# Patient Record
Sex: Male | Born: 1990
Health system: Southern US, Community
[De-identification: ages and names within clinical notes are randomized; demographics above are authoritative.]

## PROBLEM LIST (undated history)

## (undated) DIAGNOSIS — Z973 Presence of spectacles and contact lenses: Secondary | ICD-10-CM

## (undated) DIAGNOSIS — R011 Cardiac murmur, unspecified: Secondary | ICD-10-CM

## (undated) HISTORY — PX: CHOLECYSTECTOMY: SHX55

## (undated) HISTORY — PX: APPENDECTOMY: SHX54

---
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
## 2009-10-27 ENCOUNTER — Ambulatory Visit: Payer: Self-pay | Admitting: Family Medicine

## 2011-12-11 ENCOUNTER — Emergency Department: Payer: Self-pay | Admitting: General Practice

## 2019-01-14 ENCOUNTER — Encounter: Payer: Self-pay | Admitting: Emergency Medicine

## 2019-01-14 ENCOUNTER — Ambulatory Visit
Admission: EM | Admit: 2019-01-14 | Discharge: 2019-01-14 | Disposition: A | Discharge status: Home / Self Care | Payer: 59 | Attending: Family Medicine | Admitting: Family Medicine

## 2019-01-14 ENCOUNTER — Other Ambulatory Visit: Payer: Self-pay

## 2019-01-14 DIAGNOSIS — J111 Influenza due to unidentified influenza virus with other respiratory manifestations: Secondary | ICD-10-CM | POA: Diagnosis not present

## 2019-01-14 DIAGNOSIS — J029 Acute pharyngitis, unspecified: Secondary | ICD-10-CM

## 2019-01-14 DIAGNOSIS — R509 Fever, unspecified: Secondary | ICD-10-CM | POA: Diagnosis not present

## 2019-01-14 LAB — RAPID INFLUENZA A&B ANTIGENS
Influenza A (ARMC): NEGATIVE
Influenza B (ARMC): POSITIVE — AB

## 2019-01-14 LAB — RAPID STREP SCREEN (MED CTR MEBANE ONLY): Streptococcus, Group A Screen (Direct): NEGATIVE

## 2019-01-14 MED ORDER — OSELTAMIVIR PHOSPHATE 75 MG PO CAPS: 75.0000 mg | ORAL_CAPSULE | Freq: Two times a day (BID) | ORAL | 0 refills | Status: DC

## 2019-01-14 NOTE — ED Triage Notes (Signed)
Patient c/o sore throat, fever and cough that started on Monday.

## 2019-01-14 NOTE — ED Provider Notes (Signed)
[facility]    CSN: [id number] Arrival date & time: 01/14/19  1134  History   Chief Complaint Chief Complaint  Patient presents with  . Sore Throat  . Fever   HPI 28 year old male presents with the above complaints.  Patient states that he has been sick since Monday.  Reports fever, sore throat, fatigue, body aches.  Symptoms are moderate in severity.  No known exacerbating relieving factors.  No reported sick contacts.  No other associated symptoms.  No other complaints or concerns at this time.  Hx reviewed as below. PMH; Ileitis  Past Surgical History:  Procedure Laterality Date  . APPENDECTOMY    . CHOLECYSTECTOMY     Home Medications    Prior to Admission medications   Medication Sig Start Date End Date Taking? Authorizing Provider  oseltamivir (TAMIFLU) 75 MG capsule Take 1 capsule (75 mg total) by mouth every 12 (twelve) hours. 01/14/19   Tommie Samsook, Halston Fairclough G, DO   Social History Social History   Tobacco Use  . Smoking status: Never Smoker  . Smokeless tobacco: Never Used  Substance Use Topics  . Alcohol use: Never    Frequency: Never  . Drug use: Never   Allergies   Sulfa antibiotics   Review of Systems Review of Systems  Constitutional: Positive for fatigue. Negative for fever.  HENT: Positive for sore throat.   Musculoskeletal:       Bodyaches.   Physical Exam Triage Vital Signs ED Triage Vitals  Enc Vitals Group     BP 01/14/19 1147 126/83     Pulse Rate 01/14/19 1147 (!) 109     Resp 01/14/19 1147 18     Temp 01/14/19 1147 100 F (37.8 C)     Temp Source 01/14/19 1147 Oral     SpO2 01/14/19 1147 99 %     Weight 01/14/19 1145 200 lb (90.7 kg)     Height 01/14/19 1145 5\' 7"  (1.702 m)     Head Circumference --      Peak Flow --      Pain Score 01/14/19 1145 6     Pain Loc --      Pain Edu? --      Excl. in GC? --    Updated Vital Signs BP 126/83 (BP Location: Left Arm)   Pulse (!) 109   Temp 100 F (37.8 C) (Oral)   Resp 18    Ht 5\' 7"  (1.702 m)   Wt 90.7 kg   SpO2 99%   BMI 31.32 kg/m   Visual Acuity Right Eye Distance:   Left Eye Distance:   Bilateral Distance:    Right Eye Near:   Left Eye Near:    Bilateral Near:     Physical Exam Vitals signs and nursing note reviewed.  Constitutional:      General: He is not in acute distress. HENT:     Head: Normocephalic and atraumatic.     Right Ear: Tympanic membrane normal.     Left Ear: Tympanic membrane normal.     Mouth/Throat:     Pharynx: Oropharynx is clear.  Eyes:     General:        Right eye: No discharge.        Left eye: No discharge.     Conjunctiva/sclera: Conjunctivae normal.  Cardiovascular:     Rate and Rhythm: Normal rate and regular rhythm.  Pulmonary:     Effort: Pulmonary effort is normal.     Breath  sounds: No wheezing, rhonchi or rales.  Neurological:     Mental Status: He is alert.  Psychiatric:        Mood and Affect: Mood normal.        Behavior: Behavior normal.    UC Treatments / Results  Labs (all labs ordered are listed, but only abnormal results are displayed) Labs Reviewed  RAPID INFLUENZA A&B ANTIGENS ([facility] ONLY) - Abnormal; Notable for the following components:      Result Value   Influenza B ([facility]) POSITIVE (*)    All other components within normal limits  RAPID STREP SCREEN ([facility] ONLY)  CULTURE, GROUP A STREP [facility])    EKG None  Radiology No results found.  Procedures Procedures (including critical care time)  Medications Ordered in UC Medications - No data to display  Initial Impression / Assessment and Plan / UC Course  I have reviewed the triage vital signs and the nursing notes.  Pertinent labs & imaging results that were available during my care of the patient were reviewed by me and considered in my medical decision making (see chart for details).     28 year old male presents with influenza. We discussed likely limited benefit of tamiflu given duration of illness. He  elected to proceed with the medication. Work note given.  Final Clinical Impressions(s) / UC Diagnoses   Final diagnoses:  Influenza   Discharge Instructions   None    ED Prescriptions    Medication Sig Dispense Auth. Provider   oseltamivir (TAMIFLU) 75 MG capsule Take 1 capsule (75 mg total) by mouth every 12 (twelve) hours. 10 capsule Tommie Samsook, Gabreille Dardis G, DO     Controlled Substance Prescriptions [place] Controlled Substance Registry consulted? Not Applicable   Tommie SamsCook, Masaki Rothbauer G, DO 01/14/19 1346

## 2019-01-17 LAB — CULTURE, GROUP A STREP (THRC)

## 2019-01-28 ENCOUNTER — Ambulatory Visit (INDEPENDENT_AMBULATORY_CARE_PROVIDER_SITE_OTHER): Payer: 59 | Admitting: Family Medicine

## 2019-01-28 ENCOUNTER — Encounter: Payer: Self-pay | Admitting: Family Medicine

## 2019-01-28 VITALS — BP 120/64 | HR 80 | Ht 67.0 in | Wt 206.0 lb

## 2019-01-28 DIAGNOSIS — F419 Anxiety disorder, unspecified: Secondary | ICD-10-CM

## 2019-01-28 DIAGNOSIS — F321 Major depressive disorder, single episode, moderate: Secondary | ICD-10-CM | POA: Diagnosis not present

## 2019-01-28 DIAGNOSIS — Z7689 Persons encountering health services in other specified circumstances: Secondary | ICD-10-CM | POA: Diagnosis not present

## 2019-01-28 MED ORDER — SERTRALINE HCL 50 MG PO TABS: 50.0000 mg | ORAL_TABLET | Freq: Every day | ORAL | 1 refills | Status: DC

## 2019-01-28 NOTE — Progress Notes (Signed)
Date:  01/28/2019   Name:  Stephen Pratt   DOB:  12/27/1991   MRN:  [id number]   Chief Complaint: Establish Care; Depression (PHQ9=11); and Anxiety (GAD7=18)  Patient is a 28 year old male who presents for an establish care exam. The patient reports the following problems: depression. Health maintenance has been reviewed needs influenza  Depression         This is a new problem.  The current episode started more than 1 year ago.   The onset quality is gradual.   The problem occurs daily.  The problem has been gradually worsening since onset.  Associated symptoms include helplessness, hopelessness, insomnia, restlessness, decreased interest and sad.  Associated symptoms include no myalgias, no headaches and no suicidal ideas.     The symptoms are aggravated by social issues (financial).  Past treatments include nothing.  Past medical history includes anxiety.   Anxiety  Presents for initial visit. Onset was more than 5 years ago. The problem has been gradually worsening. Symptoms include depressed mood, excessive worry, insomnia, nervous/anxious behavior and restlessness. Patient reports no chest pain, dizziness, nausea, obsessions, palpitations, shortness of breath or suicidal ideas. Symptoms occur occasionally. The severity of symptoms is moderate.      Review of Systems  Constitutional: Negative for chills and fever.  HENT: Negative for drooling, ear discharge, ear pain and sore throat.   Respiratory: Negative for cough, shortness of breath and wheezing.   Cardiovascular: Negative for chest pain, palpitations and leg swelling.  Gastrointestinal: Negative for abdominal pain, blood in stool, constipation, diarrhea and nausea.  Endocrine: Negative for polydipsia.  Genitourinary: Negative for dysuria, frequency, hematuria and urgency.  Musculoskeletal: Negative for back pain, myalgias and neck pain.  Skin: Negative for rash.  Allergic/Immunologic: Negative for environmental allergies.    Neurological: Negative for dizziness and headaches.  Hematological: Does not bruise/bleed easily.  Psychiatric/Behavioral: Positive for depression. Negative for suicidal ideas. The patient is nervous/anxious and has insomnia.     There are no active problems to display for this patient.   Allergies  Allergen Reactions  . Sulfa Antibiotics     Past Surgical History:  Procedure Laterality Date  . APPENDECTOMY    . CHOLECYSTECTOMY      Social History   Tobacco Use  . Smoking status: Never Smoker  . Smokeless tobacco: Never Used  Substance Use Topics  . Alcohol use: Never    Frequency: Never  . Drug use: Never     Medication list has been reviewed and updated.  No outpatient medications have been marked as taking for the 01/28/19 encounter (Office Visit) with Duanne Limerick, MD.    [facility] 2/9 Scores 01/28/2019  PHQ - 2 Score 1  PHQ- 9 Score 11    Physical Exam Vitals signs and nursing note reviewed.  Constitutional:      Appearance: He is obese.  HENT:     Head: Normocephalic.     Right Ear: External ear normal.     Left Ear: External ear normal.     Nose: Nose normal.  Eyes:     General: No scleral icterus.       Right eye: No discharge.        Left eye: No discharge.     Conjunctiva/sclera: Conjunctivae normal.     Pupils: Pupils are equal, round, and reactive to light.  Neck:     Musculoskeletal: Normal range of motion and neck supple.     Thyroid: No  thyromegaly.     Vascular: No carotid bruit or JVD.     Trachea: No tracheal deviation.  Cardiovascular:     Rate and Rhythm: Normal rate and regular rhythm.     Heart sounds: Normal heart sounds. No murmur. No friction rub. No gallop.   Pulmonary:     Effort: No respiratory distress.     Breath sounds: Normal breath sounds. No wheezing or rales.  Abdominal:     General: Bowel sounds are normal.     Palpations: Abdomen is soft. There is no mass.     Tenderness: There is no abdominal tenderness. There is  no guarding or rebound.  Musculoskeletal: Normal range of motion.        General: No tenderness.  Lymphadenopathy:     Cervical: No cervical adenopathy.  Skin:    General: Skin is warm.     Findings: No rash.  Neurological:     Mental Status: He is alert and oriented to person, place, and time.     Cranial Nerves: No cranial nerve deficit.     Deep Tendon Reflexes: Reflexes are normal and symmetric.     BP 120/64   Pulse 80   Ht 5\' 7"  (1.702 m)   Wt 206 lb (93.4 kg)   BMI 32.26 kg/m   Assessment and Plan: 1. Establishing care with new doctor, encounter for And to establish care with new physician.  Other than depression and anxiety patient relates no concerns at this time of maintenance is been reviewed and except for influenza patient is up-to-date on everything.  2. Current moderate episode of major depressive disorder, unspecified whether recurrent (HCC) Patient has a PHQ of 11.  Has undergone a divorce and has joint custody of her child we will initiate sertraline 25 mg for 2 weeks to increase to 50 mg over the next 4 weeks.  Patient will be reevaluated in 6 weeks and a complete physical will be done at that time. - sertraline (ZOLOFT) 50 MG tablet; Take 1 tablet (50 mg total) by mouth daily. Initiate at 25 mg/one half tablet a day for 14 days then 1 a day  Dispense: 30 tablet; Refill: 1  3. Anxiety Patient scored an 18 on the gad 4.  As noted above patient was initiated on sertraline will be rechecked in 6 weeks at a complete physical. - sertraline (ZOLOFT) 50 MG tablet; Take 1 tablet (50 mg total) by mouth daily. Initiate at 25 mg/one half tablet a day for 14 days then 1 a day  Dispense: 30 tablet; Refill: 1

## 2019-03-11 ENCOUNTER — Encounter: Payer: 59 | Admitting: Family Medicine

## 2019-04-08 ENCOUNTER — Ambulatory Visit (INDEPENDENT_AMBULATORY_CARE_PROVIDER_SITE_OTHER): Payer: 59 | Admitting: Family Medicine

## 2019-04-08 ENCOUNTER — Encounter: Payer: Self-pay | Admitting: Family Medicine

## 2019-04-08 DIAGNOSIS — F321 Major depressive disorder, single episode, moderate: Secondary | ICD-10-CM

## 2019-04-08 DIAGNOSIS — F419 Anxiety disorder, unspecified: Secondary | ICD-10-CM

## 2019-04-08 MED ORDER — SERTRALINE HCL 50 MG PO TABS: 50.0000 mg | ORAL_TABLET | Freq: Every day | ORAL | 1 refills | Status: DC

## 2019-04-08 NOTE — Progress Notes (Signed)
Date:  04/08/2019   Name:  Stephen Pratt   DOB:  [birth year]   MRN:  [id number]   Chief Complaint: Depression (setraline follow up/ PHQ9=2)  I connected withthis patient, Stephen Pratt, by telephoneat the patient's home.  I verified that I am speaking with the correct person using two identifiers. This visit was conducted via telephone due to the Covid-19 outbreak from my office at [facility] in [place], [place]. I discussed the limitations, risks, security and privacy concerns of performing an evaluation and management service by telephone. I also discussed with the patient that there may be a patient responsible charge related to this service. The patient expressed understanding and agreed to proceed.  Depression         This is a chronic problem.  The current episode started more than 1 month ago.   The problem has been gradually improving since onset.  Associated symptoms include fatigue.  Associated symptoms include no decreased concentration, no helplessness, no hopelessness, does not have insomnia, not irritable, no restlessness, no decreased interest, no appetite change, no body aches, no myalgias, no headaches, no indigestion, not sad and no suicidal ideas.( irratability at times)     The symptoms are aggravated by work stress and family issues.  Past treatments include SSRIs - Selective serotonin reuptake inhibitors.  Compliance with treatment is good.  Previous treatment provided moderate relief.   Review of Systems  Constitutional: Positive for fatigue. Negative for appetite change, chills and fever.  HENT: Negative for drooling, ear discharge, ear pain and sore throat.   Respiratory: Negative for cough, shortness of breath and wheezing.   Cardiovascular: Negative for chest pain, palpitations and leg swelling.  Gastrointestinal: Negative for abdominal pain, blood in stool, constipation, diarrhea and nausea.  Endocrine: Negative for polydipsia.  Genitourinary: Negative for  dysuria, frequency, hematuria and urgency.  Musculoskeletal: Negative for back pain, myalgias and neck pain.  Skin: Negative for rash.  Allergic/Immunologic: Negative for environmental allergies.  Neurological: Negative for dizziness and headaches.  Hematological: Does not bruise/bleed easily.  Psychiatric/Behavioral: Positive for depression. Negative for decreased concentration and suicidal ideas. The patient is not nervous/anxious and does not have insomnia.     There are no active problems to display for this patient.   Allergies  Allergen Reactions  . Sulfa Antibiotics     Past Surgical History:  Procedure Laterality Date  . APPENDECTOMY    . CHOLECYSTECTOMY      Social History   Tobacco Use  . Smoking status: Never Smoker  . Smokeless tobacco: Never Used  Substance Use Topics  . Alcohol use: Never    Frequency: Never  . Drug use: Never     Medication list has been reviewed and updated.  Current Meds  Medication Sig  . sertraline (ZOLOFT) 50 MG tablet Take 1 tablet (50 mg total) by mouth daily. Initiate at 25 mg/one half tablet a day for 14 days then 1 a day    PHQ 2/9 Scores 04/08/2019 01/28/2019  PHQ - 2 Score 0 1  PHQ- 9 Score 2 11    BP Readings from Last 3 Encounters:  01/28/19 120/64  01/14/19 126/83    Physical Exam Vitals signs and nursing note reviewed.  Constitutional:      General: He is not irritable.    Wt Readings from Last 3 Encounters:  04/08/19 208 lb (94.3 kg)  01/28/19 206 lb (93.4 kg)  01/14/19 200 lb (90.7 kg)    Ht 5'  7" (1.702 m)   Wt 208 lb (94.3 kg)   BMI 32.58 kg/m   Assessment and Plan: I spent 10 minutes with this patient, More than 50% of that time was spent in voice to voice education, counseling and care coordination.  1. Current moderate episode of major depressive disorder, unspecified whether recurrent (HCC) Chronic.  Improved.  Patient is currently taking Zoloft 50 mg a day and there is been significant  improvement in his PHQ 9.  We will continue sertraline 50 mg 1 a day for the next 90 days and recheck him in 6 months. - sertraline (ZOLOFT) 50 MG tablet; Take 1 tablet (50 mg total) by mouth daily. Initiate at 25 mg/one half tablet a day for 14 days then 1 a day  Dispense: 90 tablet; Refill: 1  2. Anxiety Anxiety is also been relieved somewhat by the sertraline patient is not as irritable and is not as stressed out by work or family issues. - sertraline (ZOLOFT) 50 MG tablet; Take 1 tablet (50 mg total) by mouth daily. Initiate at 25 mg/one half tablet a day for 14 days then 1 a day  Dispense: 90 tablet; Refill: 1                                                                                                                                                                      3.  Patient has some bloating after eating certain foods.  Patient has had a history of cholecystectomy and we discussed probability that there is an issue with not having a needed bile secretion when he eats certain foods.  Patient will also look to getting some simethicone either by Gaviscon or other over-the-counter preparations.

## 2019-11-18 ENCOUNTER — Ambulatory Visit
Admission: RE | Admit: 2019-11-18 | Discharge: 2019-11-18 | Disposition: A | Discharge status: Home / Self Care | Payer: Self-pay | Attending: Family Medicine | Admitting: Family Medicine

## 2019-11-18 ENCOUNTER — Encounter: Payer: Self-pay | Admitting: Family Medicine

## 2019-11-18 ENCOUNTER — Ambulatory Visit
Admission: RE | Admit: 2019-11-18 | Discharge: 2019-11-18 | Disposition: A | Discharge status: Home / Self Care | Payer: Self-pay | Source: Ambulatory Visit | Attending: Family Medicine | Admitting: Family Medicine

## 2019-11-18 ENCOUNTER — Other Ambulatory Visit: Payer: Self-pay

## 2019-11-18 ENCOUNTER — Ambulatory Visit (INDEPENDENT_AMBULATORY_CARE_PROVIDER_SITE_OTHER): Payer: Self-pay | Admitting: Family Medicine

## 2019-11-18 VITALS — BP 130/88 | HR 72 | Ht 67.0 in | Wt 211.0 lb

## 2019-11-18 DIAGNOSIS — M25551 Pain in right hip: Secondary | ICD-10-CM

## 2019-11-18 DIAGNOSIS — R1031 Right lower quadrant pain: Secondary | ICD-10-CM

## 2019-11-18 DIAGNOSIS — S76911A Strain of unspecified muscles, fascia and tendons at thigh level, right thigh, initial encounter: Secondary | ICD-10-CM

## 2019-11-18 DIAGNOSIS — Z86012 Personal history of benign carcinoid tumor: Secondary | ICD-10-CM

## 2019-11-18 DIAGNOSIS — R935 Abnormal findings on diagnostic imaging of other abdominal regions, including retroperitoneum: Secondary | ICD-10-CM

## 2019-11-18 MED ORDER — MELOXICAM 15 MG PO TABS: 15.0000 mg | ORAL_TABLET | Freq: Every day | ORAL | 0 refills | Status: DC

## 2019-11-18 NOTE — Progress Notes (Signed)
Date:  11/18/2019   Name:  Stephen Pratt   DOB:  11-Sep-1991   MRN:  [id number]   Chief Complaint: Abdominal Pain (RLQ pain radiating into groin and down R) leg. Comes and goes, but notices it especially after sexual activity and in the late evenings when settling down from work. )  Abdominal Pain This is a recurrent problem. The current episode started more than 1 month ago (march this year). The problem occurs daily. The problem has been waxing and waning. The pain is located in the RLQ. The pain is at a severity of 5/10. The pain is moderate. Pain radiation: right leg. Associated symptoms include diarrhea. Pertinent negatives include no constipation, dysuria, fever, frequency, headaches, hematochezia, hematuria, myalgias or nausea.    No results found for: CREATININE, BUN, NA, K, CL, CO2 No results found for: CHOL, HDL, LDLCALC, LDLDIRECT, TRIG, CHOLHDL No results found for: TSH No results found for: HGBA1C   Review of Systems  Constitutional: Negative for chills and fever.  HENT: Negative for drooling, ear discharge, ear pain and sore throat.   Respiratory: Negative for cough, shortness of breath and wheezing.   Cardiovascular: Negative for chest pain, palpitations and leg swelling.  Gastrointestinal: Positive for abdominal pain and diarrhea. Negative for blood in stool, constipation, hematochezia and nausea.  Endocrine: Negative for polydipsia.  Genitourinary: Negative for dysuria, frequency, hematuria and urgency.  Musculoskeletal: Negative for back pain, myalgias and neck pain.  Skin: Negative for rash.  Allergic/Immunologic: Negative for environmental allergies.  Neurological: Negative for dizziness and headaches.  Hematological: Does not bruise/bleed easily.  Psychiatric/Behavioral: Negative for suicidal ideas. The patient is not nervous/anxious.     There are no active problems to display for this patient.   Allergies  Allergen Reactions  . Sulfa Antibiotics     Past Surgical History:  Procedure Laterality Date  . APPENDECTOMY    . CHOLECYSTECTOMY      Social History   Tobacco Use  . Smoking status: Never Smoker  . Smokeless tobacco: Never Used  Substance Use Topics  . Alcohol use: Never    Frequency: Never  . Drug use: Never     Medication list has been reviewed and updated.  No outpatient medications have been marked as taking for the 11/18/19 encounter (Office Visit) with Juline Patch, MD.    [facility] 2/9 Scores 04/08/2019 01/28/2019  PHQ - 2 Score 0 1  PHQ- 9 Score 2 11    BP Readings from Last 3 Encounters:  11/18/19 130/88  01/28/19 120/64  01/14/19 126/83    Physical Exam Vitals signs reviewed.  HENT:     Head: Normocephalic.     Right Ear: External ear normal.     Left Ear: External ear normal.     Nose: Nose normal.     Mouth/Throat:     Mouth: Mucous membranes are moist.  Eyes:     General: No scleral icterus.       Right eye: No discharge.        Left eye: No discharge.     Conjunctiva/sclera: Conjunctivae normal.     Pupils: Pupils are equal, round, and reactive to light.  Neck:     Musculoskeletal: Normal range of motion and neck supple.     Thyroid: No thyromegaly.     Vascular: No JVD.     Trachea: No tracheal deviation.  Cardiovascular:     Rate and Rhythm: Normal rate and regular rhythm.  Heart sounds: Normal heart sounds. No murmur. No friction rub. No gallop.   Pulmonary:     Effort: No respiratory distress.     Breath sounds: Normal breath sounds. No wheezing or rales.  Abdominal:     General: Bowel sounds are normal.     Palpations: Abdomen is soft. There is no hepatomegaly, splenomegaly or mass.     Tenderness: There is abdominal tenderness in the right lower quadrant. There is no right CVA tenderness, left CVA tenderness, guarding or rebound.  Musculoskeletal: Normal range of motion.     Right hip: He exhibits tenderness. He exhibits normal range of motion, normal strength, no bony  tenderness and no swelling.     Comments: Pain with resisted hip flexion  Lymphadenopathy:     Cervical: No cervical adenopathy.  Skin:    General: Skin is warm.     Findings: No rash.  Neurological:     Mental Status: He is alert and oriented to person, place, and time.     Cranial Nerves: No cranial nerve deficit.     Deep Tendon Reflexes: Reflexes are normal and symmetric.     Wt Readings from Last 3 Encounters:  11/18/19 211 lb (95.7 kg)  04/08/19 208 lb (94.3 kg)  01/28/19 206 lb (93.4 kg)    BP 130/88   Pulse 72   Ht 5\' 7"  (1.702 m)   Wt 211 lb (95.7 kg)   BMI 33.05 kg/m   Assessment and Plan: 1. Right lower quadrant abdominal pain Patient had a history of right lower quadrant pain that started several months ago which was similar to an episode that he had for which he presented [facility] 2019.  At that time it was noted some mild wall thickening involving the terminal ileum and adjacent distal ileum with adjacent stranding suggesting ileitis.  Patient was treated with Augmentin and inflammatory cannot be ruled out and for some reason patient was lost to follow-up with GI.  Pain went away but came back after words and this is for ER today with consistent pain tenderness in the right lower quadrant without rebound or guarding.  Will refer to GI for further evaluation.  Of a radiologic nature or colonoscopy.  We will get Hemoccults x3 in the meantime - Ambulatory referral to Gastroenterology  2. Hip pain, acute, right Patient has been playing a lot of softball and slides in his head also pain in the right hip extending into the right quadricep.  It does hurt to pick up the leg this occurred after the development of the abdominal pain I think these are 2 separate episodes.  We will get a hip x-ray of the right and evaluate for any possibility of concern. - DG Hip Unilat W OR W/O Pelvis 2-3 Views Right; Future  3. Strain of right iliopsoas muscle, initial  encounter There is tenderness in the right groin area consistent with a right iliopsoas muscle strain we will initiate meloxicam 15 mg once a day.  4. Abnormal abdominal CT scan Abnormal abdominal CT scan as noted with mild inflammatory stranding along the terminal ileum and adjacent distal ileum with small volume of right lower quadrant free fluid there was mild wall thickening of the adjacent terminal ileum.  This may reflect either an infectious/inflammatory ileitis.  5. Hx of benign carcinoid tumor Patient at age 28 had an appendectomy for which there was a carcinoid tumor noted.  However on review of pathology the proximal margin was negative for  carcinoid I do not think this represents a likely etiology but this is noted because of the general vicinity of this circumstance.

## 2020-01-10 ENCOUNTER — Encounter: Payer: Self-pay | Admitting: Gastroenterology

## 2020-01-10 ENCOUNTER — Other Ambulatory Visit: Payer: Self-pay

## 2020-01-10 ENCOUNTER — Ambulatory Visit (INDEPENDENT_AMBULATORY_CARE_PROVIDER_SITE_OTHER): Payer: BC Managed Care – PPO | Admitting: Gastroenterology

## 2020-01-10 VITALS — BP 150/95 | HR 99 | Temp 97.9°F | Ht 67.0 in | Wt 212.2 lb

## 2020-01-10 DIAGNOSIS — R1031 Right lower quadrant pain: Secondary | ICD-10-CM

## 2020-01-10 DIAGNOSIS — R197 Diarrhea, unspecified: Secondary | ICD-10-CM

## 2020-01-10 DIAGNOSIS — G8929 Other chronic pain: Secondary | ICD-10-CM | POA: Diagnosis not present

## 2020-01-10 NOTE — Progress Notes (Signed)
Gastroenterology Consultation  Referring Provider:     Duanne Limerick, MD Primary Care Physician:  Duanne Limerick, MD Primary Gastroenterologist:  Dr. Servando Snare     Reason for Consultation:     Right lower quadrant pain        HPI:   LATREL SZYMCZAK is a 29 y.o. y/o male referred for consultation & management of right lower quadrant pain by Dr. Yetta Barre, Vanita Panda, MD.  This patient comes in today with a history of right lower quadrant pain.  The patient was seen back in March 31 of 2019 at [facility] with a CT scan that showed:  IMPRESSION: --Mild wall thickening involving the terminal ileum and adjacent distal ileum with adjacent stranding, representing with ileitis, likely infectious/inflammatory  The patient has a history of a cholecystectomy and appendectomy in 2010.  The appendectomy showed a 1.5 cm carcinoid tumor. The patient reports that he has intermittent diarrhea without any blood.  There is also no report of any nausea and vomiting.  The patient does report that he has right lower quadrant pain that sometimes feels better when he moves his right leg because he states that the pain goes down his right leg.  There is no report of any unexplained weight loss or fevers/chills.  History reviewed. No pertinent past medical history.  Past Surgical History:  Procedure Laterality Date  . APPENDECTOMY    . CHOLECYSTECTOMY      Prior to Admission medications   Medication Sig Start Date End Date Taking? Authorizing Provider  meloxicam (MOBIC) 15 MG tablet Take 1 tablet (15 mg total) by mouth daily. 11/18/19   Duanne Limerick, MD    History reviewed. No pertinent family history.   Social History   Tobacco Use  . Smoking status: Never Smoker  . Smokeless tobacco: Never Used  Substance Use Topics  . Alcohol use: Never  . Drug use: Never    Allergies as of 01/10/2020 - Review Complete 01/10/2020  Allergen Reaction Noted  . Sulfa antibiotics  01/14/2019    Review of Systems:    All  systems reviewed and negative except where noted in HPI.   Physical Exam:  BP (!) 150/95   Pulse 99   Temp 97.9 F (36.6 C) (Temporal)   Ht 5\' 7"  (1.702 m)   Wt 212 lb 3.2 oz (96.3 kg)   BMI 33.24 kg/m  No LMP for male patient. General:   Alert,  Well-developed, well-nourished, pleasant and cooperative in NAD Head:  Normocephalic and atraumatic. Eyes:  Sclera clear, no icterus.   Conjunctiva pink. Ears:  Normal auditory acuity. Neck:  Supple; no masses or thyromegaly. Lungs:  Respirations even and unlabored.  Clear throughout to auscultation.   No wheezes, crackles, or rhonchi. No acute distress. Heart:  Regular rate and rhythm; no murmurs, clicks, rubs, or gallops. Abdomen:  Normal bowel sounds.  No bruits.  Soft, non-tender and non-distended without masses, hepatosplenomegaly or hernias noted.  No guarding or rebound tenderness.  Negative Carnett sign.   Rectal:  Deferred.  Pulses:  Normal pulses noted. Extremities:  No clubbing or edema.  No cyanosis. Neurologic:  Alert and oriented x3;  grossly normal neurologically. Skin:  Intact without significant lesions or rashes.  No jaundice. Lymph Nodes:  No significant cervical adenopathy. Psych:  Alert and cooperative. Normal mood and affect.  Imaging Studies: No results found.  Assessment and Plan:   [name] is a 29 y.o. y/o male who comes in  today with right lower quadrant abdominal pain with a CT scan showing some terminal ileitis in the past.  He also has a history of a carcinoid tumor when he had his appendix out in 2010.  The patient has also had his gallbladder out.  He denies any family history of inflammatory bowel disease but states that his mother has GI problems and he thinks she had diverticulitis.  The patient will be set up for colonoscopy to evaluate the right lower quadrant for any sign of inflammatory bowel disease or recurrence of his carcinoid tumor.  The patient has been explained the plan and agrees with  it.    Maayan Jenning, MD. FACG    Note: This dictation was prepared with Dragon dictation along with smaller phrase technology. Any transcriptional errors that result from this process are unintentional.   

## 2020-01-10 NOTE — H&P (View-Only) (Signed)
Gastroenterology Consultation  Referring Provider:     Duanne Limerick, MD Primary Care Physician:  Duanne Limerick, MD Primary Gastroenterologist:  Dr. Servando Snare     Reason for Consultation:     Right lower quadrant pain        HPI:   Stephen Pratt is a 29 y.o. y/o male referred for consultation & management of right lower quadrant pain by Dr. Yetta Barre, Vanita Panda, MD.  This patient comes in today with a history of right lower quadrant pain.  The patient was seen back in March 31 of 2019 at [facility] with a CT scan that showed:  IMPRESSION: --Mild wall thickening involving the terminal ileum and adjacent distal ileum with adjacent stranding, representing with ileitis, likely infectious/inflammatory  The patient has a history of a cholecystectomy and appendectomy in 2010.  The appendectomy showed a 1.5 cm carcinoid tumor. The patient reports that he has intermittent diarrhea without any blood.  There is also no report of any nausea and vomiting.  The patient does report that he has right lower quadrant pain that sometimes feels better when he moves his right leg because he states that the pain goes down his right leg.  There is no report of any unexplained weight loss or fevers/chills.  History reviewed. No pertinent past medical history.  Past Surgical History:  Procedure Laterality Date  . APPENDECTOMY    . CHOLECYSTECTOMY      Prior to Admission medications   Medication Sig Start Date End Date Taking? Authorizing Provider  meloxicam (MOBIC) 15 MG tablet Take 1 tablet (15 mg total) by mouth daily. 11/18/19   Duanne Limerick, MD    History reviewed. No pertinent family history.   Social History   Tobacco Use  . Smoking status: Never Smoker  . Smokeless tobacco: Never Used  Substance Use Topics  . Alcohol use: Never  . Drug use: Never    Allergies as of 01/10/2020 - Review Complete 01/10/2020  Allergen Reaction Noted  . Sulfa antibiotics  01/14/2019    Review of Systems:    All  systems reviewed and negative except where noted in HPI.   Physical Exam:  BP (!) 150/95   Pulse 99   Temp 97.9 F (36.6 C) (Temporal)   Ht 5\' 7"  (1.702 m)   Wt 212 lb 3.2 oz (96.3 kg)   BMI 33.24 kg/m  No LMP for male patient. General:   Alert,  Well-developed, well-nourished, pleasant and cooperative in NAD Head:  Normocephalic and atraumatic. Eyes:  Sclera clear, no icterus.   Conjunctiva pink. Ears:  Normal auditory acuity. Neck:  Supple; no masses or thyromegaly. Lungs:  Respirations even and unlabored.  Clear throughout to auscultation.   No wheezes, crackles, or rhonchi. No acute distress. Heart:  Regular rate and rhythm; no murmurs, clicks, rubs, or gallops. Abdomen:  Normal bowel sounds.  No bruits.  Soft, non-tender and non-distended without masses, hepatosplenomegaly or hernias noted.  No guarding or rebound tenderness.  Negative Carnett sign.   Rectal:  Deferred.  Pulses:  Normal pulses noted. Extremities:  No clubbing or edema.  No cyanosis. Neurologic:  Alert and oriented x3;  grossly normal neurologically. Skin:  Intact without significant lesions or rashes.  No jaundice. Lymph Nodes:  No significant cervical adenopathy. Psych:  Alert and cooperative. Normal mood and affect.  Imaging Studies: No results found.  Assessment and Plan:   Stephen Pratt is a 41 y.o. y/o male who comes in  today with right lower quadrant abdominal pain with a CT scan showing some terminal ileitis in the past.  He also has a history of a carcinoid tumor when he had his appendix out in 2010.  The patient has also had his gallbladder out.  He denies any family history of inflammatory bowel disease but states that his mother has GI problems and he thinks she had diverticulitis.  The patient will be set up for colonoscopy to evaluate the right lower quadrant for any sign of inflammatory bowel disease or recurrence of his carcinoid tumor.  The patient has been explained the plan and agrees with  it.    Lucilla Lame, MD. Marval Regal    Note: This dictation was prepared with Dragon dictation along with smaller phrase technology. Any transcriptional errors that result from this process are unintentional.

## 2020-01-11 ENCOUNTER — Other Ambulatory Visit: Payer: Self-pay

## 2020-01-11 DIAGNOSIS — R197 Diarrhea, unspecified: Secondary | ICD-10-CM

## 2020-01-14 ENCOUNTER — Encounter: Payer: Self-pay | Admitting: *Deleted

## 2020-01-17 ENCOUNTER — Telehealth: Payer: Self-pay

## 2020-01-17 NOTE — Telephone Encounter (Signed)
Tried to call patient because Dr. Servando Snare is not doing procedures on 01/31/2020 and need to move to a different day. Unable to leave a message due to mailbox being full

## 2020-01-26 ENCOUNTER — Other Ambulatory Visit: Payer: Self-pay

## 2020-01-26 ENCOUNTER — Encounter: Payer: Self-pay | Admitting: Gastroenterology

## 2020-01-28 ENCOUNTER — Other Ambulatory Visit
Admission: RE | Admit: 2020-01-28 | Discharge: 2020-01-28 | Disposition: A | Discharge status: Home / Self Care | Payer: BC Managed Care – PPO | Source: Ambulatory Visit | Attending: Gastroenterology | Admitting: Gastroenterology

## 2020-01-28 ENCOUNTER — Other Ambulatory Visit: Payer: Self-pay

## 2020-01-28 DIAGNOSIS — Z20822 Contact with and (suspected) exposure to covid-19: Secondary | ICD-10-CM | POA: Diagnosis not present

## 2020-01-28 DIAGNOSIS — Z01812 Encounter for preprocedural laboratory examination: Secondary | ICD-10-CM | POA: Diagnosis present

## 2020-01-28 LAB — SARS CORONAVIRUS 2 (TAT 6-24 HRS): SARS Coronavirus 2: NEGATIVE

## 2020-01-31 NOTE — Discharge Instructions (Signed)
General Anesthesia, Adult, Care After This sheet gives you information about how to care for yourself after your procedure. Your health care provider may also give you more specific instructions. If you have problems or questions, contact your health care provider. What can I expect after the procedure? After the procedure, the following side effects are common:  Pain or discomfort at the IV site.  Nausea.  Vomiting.  Sore throat.  Trouble concentrating.  Feeling cold or chills.  Weak or tired.  Sleepiness and fatigue.  Soreness and body aches. These side effects can affect parts of the body that were not involved in surgery. Follow these instructions at home:  For at least 24 hours after the procedure:  Have a responsible adult stay with you. It is important to have someone help care for you until you are awake and alert.  Rest as needed.  Do not: ? Participate in activities in which you could fall or become injured. ? Drive. ? Use heavy machinery. ? Drink alcohol. ? Take sleeping pills or medicines that cause drowsiness. ? Make important decisions or sign legal documents. ? Take care of children on your own. Eating and drinking  Follow any instructions from your health care provider about eating or drinking restrictions.  When you feel hungry, start by eating small amounts of foods that are soft and easy to digest (bland), such as toast. Gradually return to your regular diet.  Drink enough fluid to keep your urine pale yellow.  If you vomit, rehydrate by drinking water, juice, or clear broth. General instructions  If you have sleep apnea, surgery and certain medicines can increase your risk for breathing problems. Follow instructions from your health care provider about wearing your sleep device: ? Anytime you are sleeping, including during daytime naps. ? While taking prescription pain medicines, sleeping medicines, or medicines that make you drowsy.  Return to  your normal activities as told by your health care provider. Ask your health care provider what activities are safe for you.  Take over-the-counter and prescription medicines only as told by your health care provider.  If you smoke, do not smoke without supervision.  Keep all follow-up visits as told by your health care provider. This is important. Contact a health care provider if:  You have nausea or vomiting that does not get better with medicine.  You cannot eat or drink without vomiting.  You have pain that does not get better with medicine.  You are unable to pass urine.  You develop a skin rash.  You have a fever.  You have redness around your IV site that gets worse. Get help right away if:  You have difficulty breathing.  You have chest pain.  You have blood in your urine or stool, or you vomit blood. Summary  After the procedure, it is common to have a sore throat or nausea. It is also common to feel tired.  Have a responsible adult stay with you for the first 24 hours after general anesthesia. It is important to have someone help care for you until you are awake and alert.  When you feel hungry, start by eating small amounts of foods that are soft and easy to digest (bland), such as toast. Gradually return to your regular diet.  Drink enough fluid to keep your urine pale yellow.  Return to your normal activities as told by your health care provider. Ask your health care provider what activities are safe for you. This information is not   intended to replace advice given to you by your health care provider. Make sure you discuss any questions you have with your health care provider. Document Revised: 12/19/2017 Document Reviewed: 08/01/2017 Elsevier Patient Education  2020 Elsevier Inc.  

## 2020-02-01 ENCOUNTER — Ambulatory Visit: Payer: BC Managed Care – PPO | Admitting: Anesthesiology

## 2020-02-01 ENCOUNTER — Other Ambulatory Visit: Payer: Self-pay

## 2020-02-01 ENCOUNTER — Encounter
Admission: RE | Disposition: A | Discharge status: Home / Self Care | Payer: Self-pay | Source: Home / Self Care | Attending: Gastroenterology

## 2020-02-01 ENCOUNTER — Ambulatory Visit
Admission: RE | Admit: 2020-02-01 | Discharge: 2020-02-01 | Disposition: A | Discharge status: Home / Self Care | Payer: BC Managed Care – PPO | Attending: Gastroenterology | Admitting: Gastroenterology

## 2020-02-01 ENCOUNTER — Encounter: Payer: Self-pay | Admitting: Gastroenterology

## 2020-02-01 DIAGNOSIS — Z87891 Personal history of nicotine dependence: Secondary | ICD-10-CM | POA: Insufficient documentation

## 2020-02-01 DIAGNOSIS — K529 Noninfective gastroenteritis and colitis, unspecified: Secondary | ICD-10-CM

## 2020-02-01 DIAGNOSIS — R933 Abnormal findings on diagnostic imaging of other parts of digestive tract: Secondary | ICD-10-CM | POA: Diagnosis not present

## 2020-02-01 DIAGNOSIS — Z791 Long term (current) use of non-steroidal anti-inflammatories (NSAID): Secondary | ICD-10-CM | POA: Diagnosis not present

## 2020-02-01 DIAGNOSIS — Z86012 Personal history of benign carcinoid tumor: Secondary | ICD-10-CM | POA: Diagnosis not present

## 2020-02-01 DIAGNOSIS — R197 Diarrhea, unspecified: Secondary | ICD-10-CM | POA: Insufficient documentation

## 2020-02-01 DIAGNOSIS — R1031 Right lower quadrant pain: Secondary | ICD-10-CM | POA: Insufficient documentation

## 2020-02-01 HISTORY — DX: Presence of spectacles and contact lenses: Z97.3

## 2020-02-01 HISTORY — PX: COLONOSCOPY WITH PROPOFOL: SHX5780

## 2020-02-01 HISTORY — DX: Cardiac murmur, unspecified: R01.1

## 2020-02-01 SURGERY — COLONOSCOPY WITH PROPOFOL
Anesthesia: General | Site: Rectum

## 2020-02-01 MED ORDER — LACTATED RINGERS IV SOLN: INTRAVENOUS | Status: DC

## 2020-02-01 MED ORDER — PROPOFOL 10 MG/ML IV BOLUS
INTRAVENOUS | Status: DC | PRN
  Administered 2020-02-01 (×2): 200 mg via INTRAVENOUS

## 2020-02-01 MED ORDER — STERILE WATER FOR IRRIGATION IR SOLN
Status: DC | PRN
  Administered 2020-02-01: 12:00:00 50 mL

## 2020-02-01 MED ORDER — LIDOCAINE HCL (CARDIAC) PF 100 MG/5ML IV SOSY
PREFILLED_SYRINGE | INTRAVENOUS | Status: DC | PRN
  Administered 2020-02-01: 30 mg via INTRAVENOUS

## 2020-02-01 SURGICAL SUPPLY — 6 items
CANISTER SUCT 1200ML W/VALVE (MISCELLANEOUS) ×2 IMPLANT
FORCEPS BIOP RAD 4 LRG CAP 4 (CUTTING FORCEPS) ×2 IMPLANT
GOWN CVR UNV OPN BCK APRN NK (MISCELLANEOUS) ×2 IMPLANT
GOWN ISOL THUMB LOOP REG UNIV (MISCELLANEOUS) ×2
KIT ENDO PROCEDURE OLY (KITS) ×2 IMPLANT
WATER STERILE IRR 250ML POUR (IV SOLUTION) ×2 IMPLANT

## 2020-02-01 NOTE — Op Note (Signed)
[facility] Patient Name: Stephen Pratt Procedure Date: 02/01/2020 11:47 AM MRN: [id number] Account #: [id number] Date of Birth: 1991/01/12 Admit Type: Outpatient Age: 29 Room: [facility] OR ROOM 01 Gender: Male Note Status: Finalized Procedure:             Colonoscopy Indications:           Chronic diarrhea Providers:             Midge Minium MD, MD Referring MD:          Duanne Limerick, MD (Referring MD) Medicines:             Propofol per Anesthesia Complications:         No immediate complications. Procedure:             Pre-Anesthesia Assessment:                        - Prior to the procedure, a History and Physical was                         performed, and patient medications and allergies were                         reviewed. The patient's tolerance of previous                         anesthesia was also reviewed. The risks and benefits                         of the procedure and the sedation options and risks                         were discussed with the patient. All questions were                         answered, and informed consent was obtained. Prior                         Anticoagulants: The patient has taken no previous                         anticoagulant or antiplatelet agents. ASA Grade                         Assessment: II - A patient with mild systemic disease.                         After reviewing the risks and benefits, the patient                         was deemed in satisfactory condition to undergo the                         procedure.                        After obtaining informed consent, the colonoscope was  passed under direct vision. Throughout the procedure,                         the patient's blood pressure, pulse, and oxygen                         saturations were monitored continuously. The was                         introduced through the anus and advanced to the the               terminal ileum. The colonoscopy was performed without                         difficulty. The patient tolerated the procedure well.                         The quality of the bowel preparation was excellent. Findings:      The perianal and digital rectal examinations were normal.      The terminal ileum appeared normal. Biopsies were taken with a cold       forceps for histology.      The colon (entire examined portion) appeared normal. Biopsies were taken       with a cold forceps for histology. Impression:            - The examined portion of the ileum was normal.                         Biopsied.                        - The entire examined colon is normal. Biopsied. Recommendation:        - Discharge patient to home.                        - Resume previous diet.                        - Continue present medications.                        - Await pathology results. Procedure Code(s):     --- Professional ---                        [phone], Colonoscopy, flexible; with biopsy, single or                         multiple Diagnosis Code(s):     --- Professional ---                        K52.9, Noninfective gastroenteritis and colitis,                         unspecified CPT copyright 2019 American Medical Association. All rights reserved. The codes documented in this report are preliminary and upon coder review may  be revised to meet current compliance requirements. Midge Minium MD, MD 02/01/2020 12:05:11 PM This report has been signed electronically. Number of Addenda: 0 Note Initiated On: 02/01/2020 11:47  AM Scope Withdrawal Time: 0 hours 7 minutes 56 seconds  Total Procedure Duration: 0 hours 10 minutes 15 seconds  Estimated Blood Loss:  Estimated blood loss: none.      [facility]

## 2020-02-01 NOTE — Anesthesia Procedure Notes (Signed)
Procedure Name: MAC Performed by: Izetta Dakin, CRNA Pre-anesthesia Checklist: Timeout performed, Patient being monitored, Suction available, Patient identified and Emergency Drugs available Patient Re-evaluated:Patient Re-evaluated prior to induction Oxygen Delivery Method: Nasal cannula

## 2020-02-01 NOTE — Transfer of Care (Signed)
Immediate Anesthesia Transfer of Care Note  Patient: Stephen Pratt  Procedure(s) Performed: COLONOSCOPY WITH BIOPSY (N/A Rectum)  Patient Location: PACU  Anesthesia Type: General  Level of Consciousness: awake, alert  and patient cooperative  Airway and Oxygen Therapy: Patient Spontanous Breathing and Patient connected to supplemental oxygen  Post-op Assessment: Post-op Vital signs reviewed, Patient's Cardiovascular Status Stable, Respiratory Function Stable, Patent Airway and No signs of Nausea or vomiting  Post-op Vital Signs: Reviewed and stable  Complications: No apparent anesthesia complications

## 2020-02-01 NOTE — Interval H&P Note (Signed)
History and Physical Interval Note:  02/01/2020 11:19 AM  Stephen Pratt  has presented today for surgery, with the diagnosis of diarrhea R19.7.  The various methods of treatment have been discussed with the patient and family. After consideration of risks, benefits and other options for treatment, the patient has consented to  Procedure(s) with comments: COLONOSCOPY WITH PROPOFOL (N/A) - Priority 3 as a surgical intervention.  The patient's history has been reviewed, patient examined, no change in status, stable for surgery.  I have reviewed the patient's chart and labs.  Questions were answered to the patient's satisfaction.     Myndi Wamble [employer]

## 2020-02-01 NOTE — Anesthesia Preprocedure Evaluation (Signed)
Anesthesia Evaluation  Patient identified by MRN, date of birth, ID band Patient awake    Reviewed: NPO status   History of Anesthesia Complications Negative for: history of anesthetic complications  Airway Mallampati: II  TM Distance: >3 FB Neck ROM: full    Dental  (+) Chipped Left back chipped:   Pulmonary neg pulmonary ROS, former smoker,    Pulmonary exam normal        Cardiovascular Exercise Tolerance: Good negative cardio ROS Normal cardiovascular exam+ Valvular Problems/Murmurs (childhood > benign)      Neuro/Psych negative neurological ROS  negative psych ROS   GI/Hepatic negative GI ROS, Neg liver ROS,   Endo/Other  negative endocrine ROS  Renal/GU negative Renal ROS  negative genitourinary   Musculoskeletal   Abdominal   Peds  Hematology negative hematology ROS (+)   Anesthesia Other Findings Covid: NEG.  Reproductive/Obstetrics                             Anesthesia Physical Anesthesia Plan  ASA: II  Anesthesia Plan: General   Post-op Pain Management:    Induction:   PONV Risk Score and Plan: 2 and Propofol infusion and TIVA  Airway Management Planned:   Additional Equipment:   Intra-op Plan:   Post-operative Plan:   Informed Consent: I have reviewed the patients History and Physical, chart, labs and discussed the procedure including the risks, benefits and alternatives for the proposed anesthesia with the patient or authorized representative who has indicated his/her understanding and acceptance.       Plan Discussed with: CRNA  Anesthesia Plan Comments:         Anesthesia Quick Evaluation

## 2020-02-01 NOTE — Anesthesia Postprocedure Evaluation (Signed)
Anesthesia Post Note  Patient: Stephen Pratt  Procedure(s) Performed: COLONOSCOPY WITH BIOPSY (N/A Rectum)     Patient location during evaluation: PACU Anesthesia Type: General Level of consciousness: awake and alert Pain management: pain level controlled Vital Signs Assessment: post-procedure vital signs reviewed and stable Respiratory status: spontaneous breathing, nonlabored ventilation, respiratory function stable and patient connected to nasal cannula oxygen Cardiovascular status: blood pressure returned to baseline and stable Postop Assessment: no apparent nausea or vomiting Anesthetic complications: no    Bennett Vanscyoc

## 2020-02-02 ENCOUNTER — Encounter: Payer: Self-pay | Admitting: *Deleted

## 2020-02-03 ENCOUNTER — Encounter: Payer: Self-pay | Admitting: Gastroenterology

## 2020-02-03 LAB — SURGICAL PATHOLOGY

## 2020-02-07 ENCOUNTER — Other Ambulatory Visit: Payer: Self-pay

## 2020-04-07 ENCOUNTER — Ambulatory Visit: Payer: BC Managed Care – PPO

## 2020-04-08 ENCOUNTER — Ambulatory Visit: Payer: BC Managed Care – PPO | Attending: Oncology

## 2020-04-08 DIAGNOSIS — Z23 Encounter for immunization: Secondary | ICD-10-CM

## 2020-04-08 NOTE — Progress Notes (Signed)
   Covid-19 Vaccination Clinic  Name:  ODEAN FESTER    MRN: [id number] DOB: 1991-07-24  04/08/2020  Mr. Delman was observed post Covid-19 immunization for 15 minutes without incident. He was provided with Vaccine Information Sheet and instruction to access the V-Safe system.   Mr. Schiller was instructed to call 911 with any severe reactions post vaccine: Marland Kitchen Difficulty breathing  . Swelling of face and throat  . A fast heartbeat  . A bad rash all over body  . Dizziness and weakness   Immunizations Administered    Name Date Dose VIS Date Route   Pfizer COVID-19 Vaccine 04/08/2020  9:48 AM 0.3 mL 12/10/2019 Intramuscular   Manufacturer: [employer], [employer]   Lot: [id number]   NDC: [address]-8

## 2020-05-02 ENCOUNTER — Ambulatory Visit: Payer: BC Managed Care – PPO | Attending: Internal Medicine

## 2020-05-02 DIAGNOSIS — Z23 Encounter for immunization: Secondary | ICD-10-CM

## 2020-05-02 NOTE — Progress Notes (Signed)
   Covid-19 Vaccination Clinic  Name:  BRAZOS SANDOVAL    MRN: [id number] DOB: 1991-10-30  05/02/2020  Mr. Schramm was observed post Covid-19 immunization for 15 minutes without incident. He was provided with Vaccine Information Sheet and instruction to access the V-Safe system.   Mr. Cindric was instructed to call 911 with any severe reactions post vaccine: Marland Kitchen Difficulty breathing  . Swelling of face and throat  . A fast heartbeat  . A bad rash all over body  . Dizziness and weakness   Immunizations Administered    Name Date Dose VIS Date Route   Pfizer COVID-19 Vaccine 05/02/2020  1:53 PM 0.3 mL 02/23/2019 Intramuscular   Manufacturer: [employer], [employer]   Lot: [id number]   NDC: [address]-0

## 2020-10-26 IMAGING — CR DG HIP (WITH OR WITHOUT PELVIS) 2-3V*R*
3 series · 3 of 3 positions shown · non-contrast
Comparison: None.

CLINICAL DATA: Right hip pain

EXAM:
DG HIP (WITH OR WITHOUT PELVIS) 2-3V RIGHT

[pelvis ap]
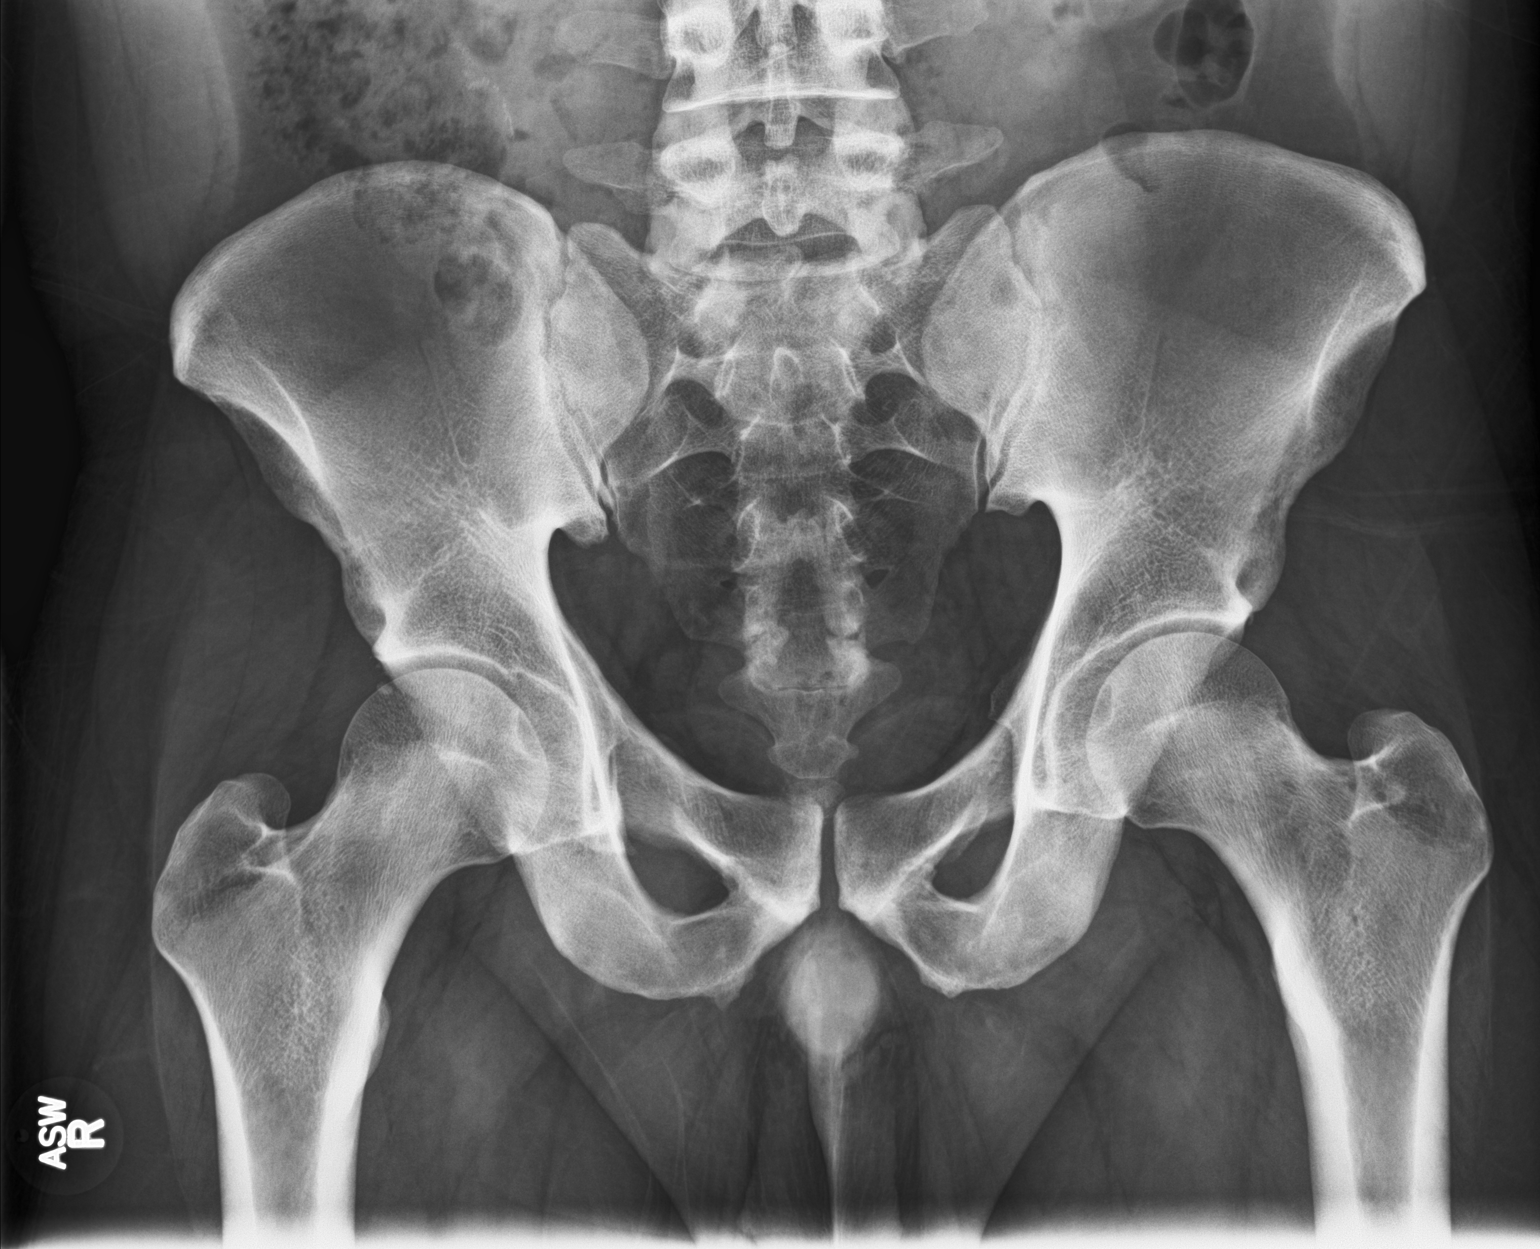

[hip ap]
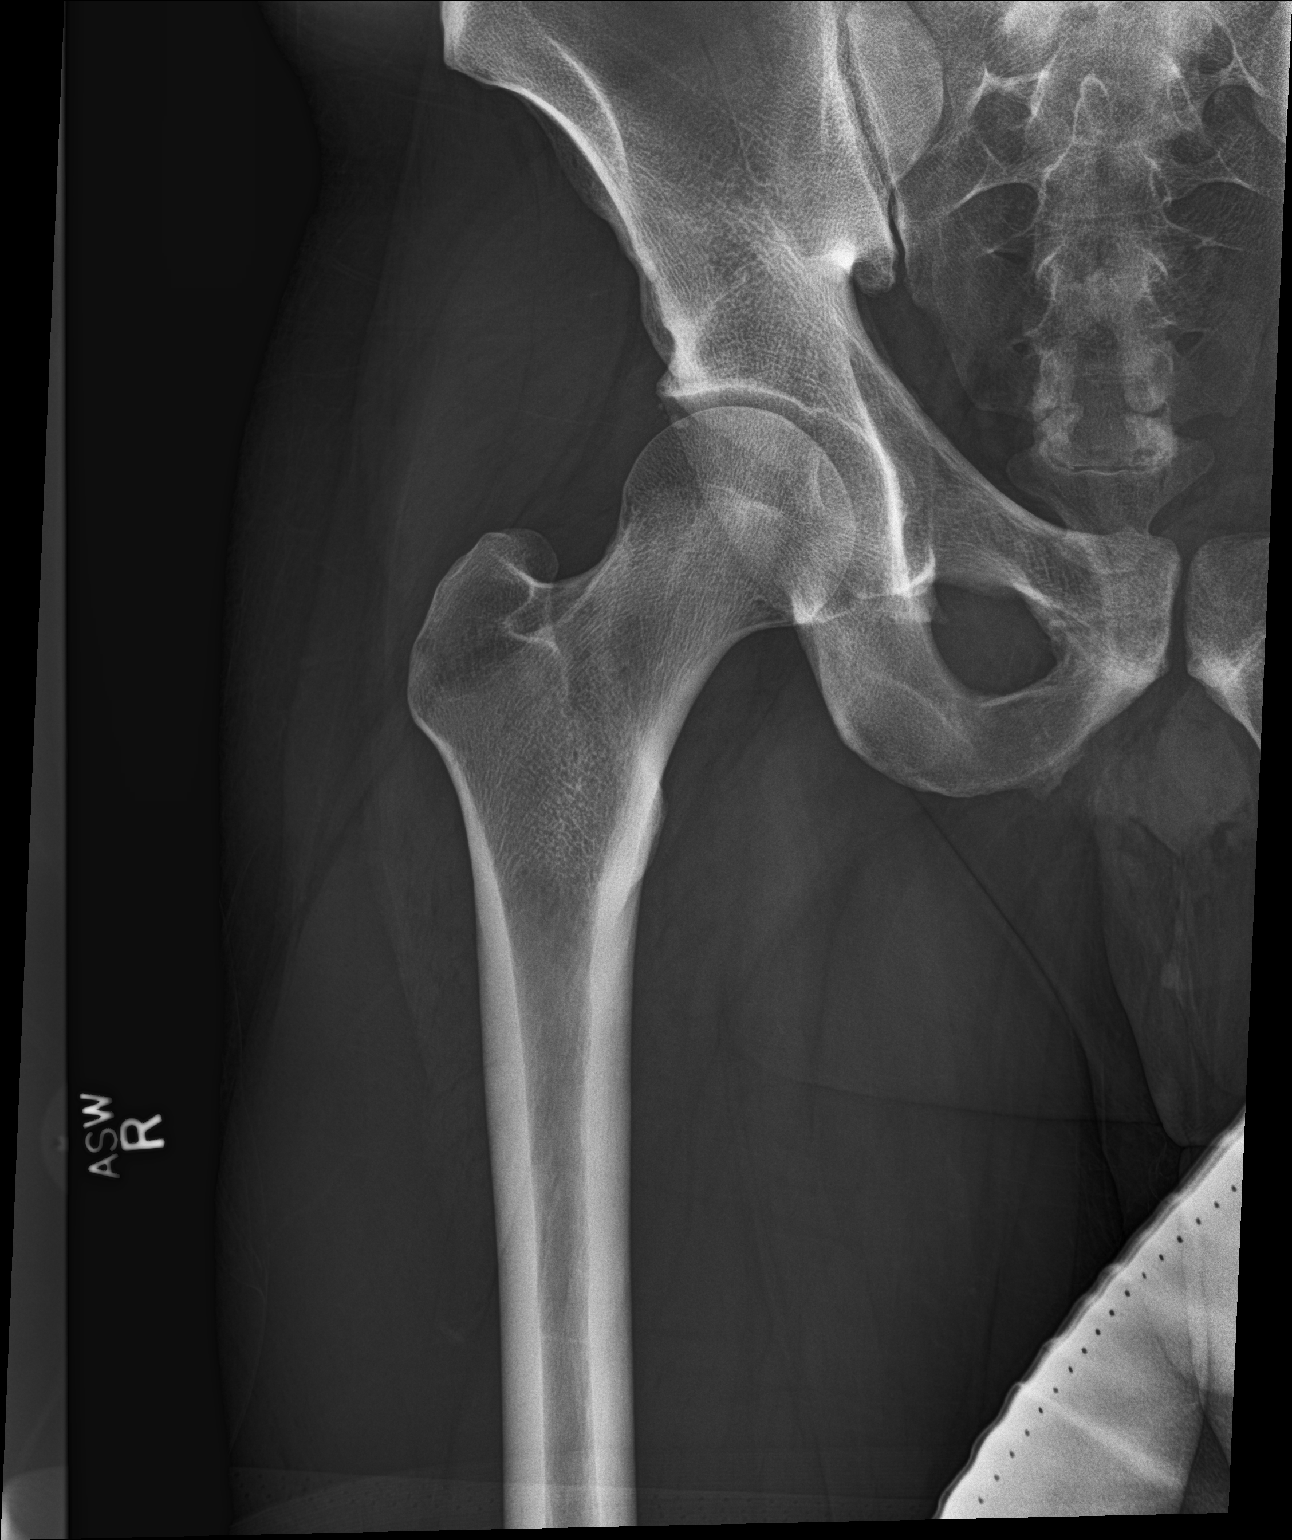

[hip lat]
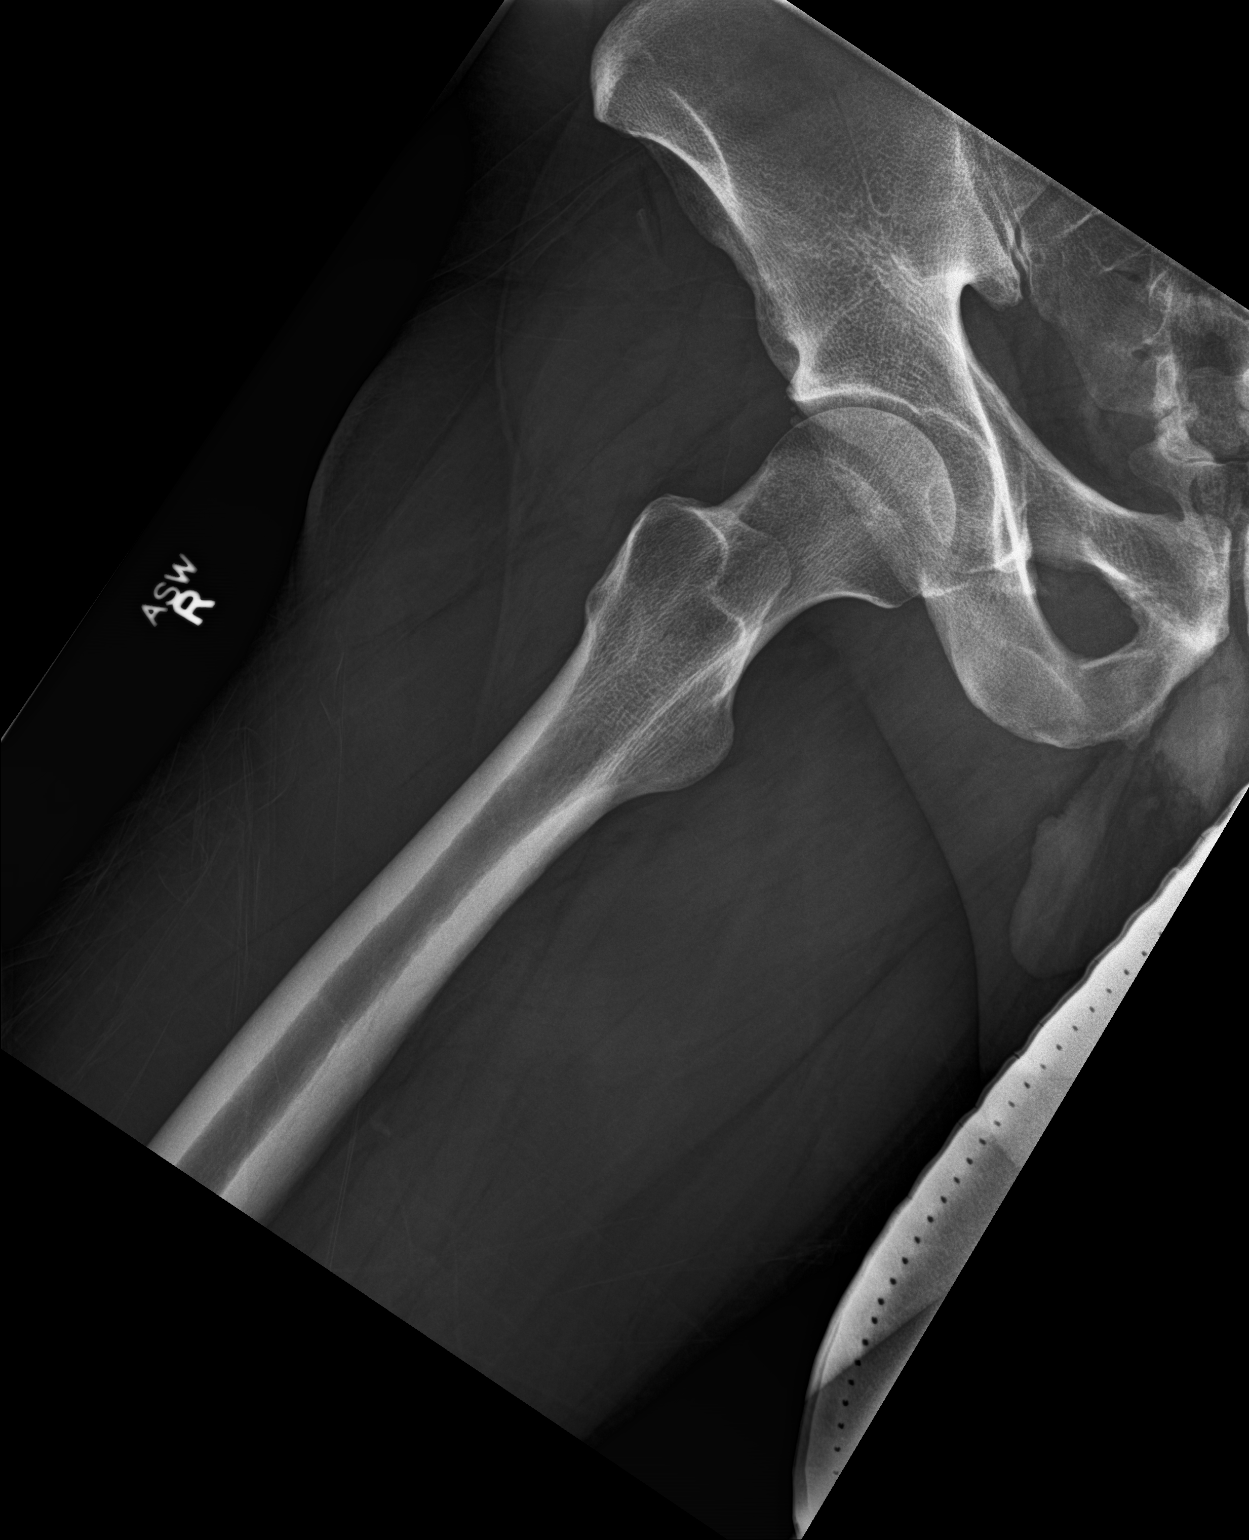

[3 of 3 positions shown; findings below may reference images not displayed]

FINDINGS: Hip joints and SI joints symmetric and unremarkable. No acute bony
abnormality. Specifically, no fracture, subluxation, or dislocation.
IMPRESSION: Negative.

## 2020-11-13 ENCOUNTER — Other Ambulatory Visit: Payer: BC Managed Care – PPO

## 2020-11-13 DIAGNOSIS — Z20822 Contact with and (suspected) exposure to covid-19: Secondary | ICD-10-CM

## 2020-11-14 LAB — SARS-COV-2, NAA 2 DAY TAT

## 2020-11-14 LAB — NOVEL CORONAVIRUS, NAA: SARS-CoV-2, NAA: DETECTED — AB

## 2020-11-15 ENCOUNTER — Telehealth: Payer: Self-pay | Admitting: Nurse Practitioner

## 2020-11-15 NOTE — Telephone Encounter (Signed)
Called to Discuss with patient about Covid symptoms and the use of the monoclonal antibody infusion for those with mild to moderate Covid symptoms and at a high risk of hospitalization.     Pt appears to qualify for this infusion due to co-morbid conditions and/or a member of an at-risk group in accordance with the FDA Emergency Use Authorization. (BMI >25).  After discussing with patient he reports symptoms are mild and seem to be getting better with only sinus congestion currently. Declines infusion but will contact Infusion center if he changes his mind or symptoms worsen.   Symptom onset 11/12/20.    Stephen Alma, NP [facility]  [phone]

## 2023-04-25 ENCOUNTER — Ambulatory Visit: Payer: BC Managed Care – PPO | Admitting: Family Medicine

## 2023-04-28 ENCOUNTER — Ambulatory Visit: Payer: BC Managed Care – PPO | Admitting: Family Medicine

## 2023-04-28 ENCOUNTER — Ambulatory Visit
Admission: RE | Admit: 2023-04-28 | Discharge: 2023-04-28 | Disposition: A | Discharge status: Home / Self Care | Payer: BLUE CROSS/BLUE SHIELD | Attending: Family Medicine | Admitting: Family Medicine

## 2023-04-28 ENCOUNTER — Encounter: Payer: Self-pay | Admitting: Family Medicine

## 2023-04-28 ENCOUNTER — Ambulatory Visit
Admission: RE | Admit: 2023-04-28 | Discharge: 2023-04-28 | Disposition: A | Discharge status: Home / Self Care | Payer: BLUE CROSS/BLUE SHIELD | Source: Ambulatory Visit | Attending: Family Medicine | Admitting: Family Medicine

## 2023-04-28 VITALS — BP 120/78 | HR 72 | Ht 67.0 in | Wt 207.0 lb

## 2023-04-28 DIAGNOSIS — S8001XA Contusion of right knee, initial encounter: Secondary | ICD-10-CM | POA: Insufficient documentation

## 2023-04-28 MED ORDER — MELOXICAM 15 MG PO TABS: 15.0000 mg | ORAL_TABLET | Freq: Every day | ORAL | 0 refills | Status: DC

## 2023-04-28 NOTE — Progress Notes (Signed)
Date:  04/28/2023   Name:  Stephen Pratt   DOB:  April 22, 1991   MRN:  [id number]   Chief Complaint: Knee Pain (R) knee- diving for ball and knee hit ground x 2 yesterday - been swollen and painful since. )  Knee Pain  The incident occurred 12 to 24 hours ago. [place]. The injury mechanism was a direct blow. The pain is present in the right knee. The quality of the pain is described as aching. The pain is at a severity of 7/10. The pain is moderate. The pain has been Constant since onset. Pertinent negatives include no loss of sensation, muscle weakness, numbness or tingling. The symptoms are aggravated by palpation, weight bearing and movement. He has tried ice and NSAIDs for the symptoms. The treatment provided moderate relief.    No results found for: "NA", "K", "CO2", "GLUCOSE", "BUN", "CREATININE", "CALCIUM", "EGFR", "GFRNONAA" No results found for: "CHOL", "HDL", "LDLCALC", "LDLDIRECT", "TRIG", "CHOLHDL" No results found for: "TSH" No results found for: "HGBA1C" No results found for: "WBC", "HGB", "HCT", "MCV", "PLT" No results found for: "ALT", "AST", "GGT", "ALKPHOS", "BILITOT" No results found for: "25OHVITD2", "25OHVITD3", "VD25OH"   Review of Systems  Respiratory:  Negative for cough and shortness of breath.   Cardiovascular:  Negative for chest pain, palpitations and leg swelling.  Musculoskeletal:  Positive for arthralgias. Negative for joint swelling and myalgias.  Neurological:  Negative for tingling, weakness and numbness.    Patient Active Problem List   Diagnosis Date Noted   Noninfectious diarrhea     Allergies  Allergen Reactions   Sulfa Antibiotics Rash    Past Surgical History:  Procedure Laterality Date   APPENDECTOMY  2010   CHOLECYSTECTOMY  2010   COLONOSCOPY WITH PROPOFOL N/A 02/01/2020   Procedure: COLONOSCOPY WITH BIOPSY;  Surgeon: Midge Minium, MD;  Location: [facility];  Service: Endoscopy;  Laterality: N/A;   Priority 3    Social History   Tobacco Use   Smoking status: Former    Types: Cigarettes    Quit date: 10/2019    Years since quitting: 3.4   Smokeless tobacco: Never   Tobacco comments:    smoked "socially" for about 10 yrs  Vaping Use   Vaping Use: Never used  Substance Use Topics   Alcohol use: Not Currently    Comment: may have a drink 4-5x/yr   Drug use: Never     Medication list has been reviewed and updated.  No outpatient medications have been marked as taking for the 04/28/23 encounter (Office Visit) with Duanne Limerick, MD.       04/28/2023    4:10 PM 01/28/2019    2:23 PM  GAD 7 : Generalized Anxiety Score  Nervous, Anxious, on Edge 0 3  Control/stop worrying 0 2  Worry too much - different things 0 3  Trouble relaxing 0 2  Restless 0 3  Easily annoyed or irritable 0 3  Afraid - awful might happen 0 2  Total GAD 7 Score 0 18  Anxiety Difficulty Not difficult at all Somewhat difficult       04/28/2023    4:10 PM 04/08/2019    1:48 PM 01/28/2019    2:20 PM  Depression screen PHQ 2/9  Decreased Interest 0 0 0  Down, Depressed, Hopeless 0 0 1  PHQ - 2 Score 0 0 1  Altered sleeping 0 0 3  Tired, decreased energy 0 1 2  Change  in appetite 0 1 3  Feeling bad or failure about yourself  0 0 0  Trouble concentrating 0 0 0  Moving slowly or fidgety/restless 1 0 2  Suicidal thoughts 0 0 0  PHQ-9 Score 1 2 11   Difficult doing work/chores Not difficult at all Not difficult at all Somewhat difficult    BP Readings from Last 3 Encounters:  04/28/23 120/78  02/01/20 122/79  01/10/20 (!) 150/95    Physical Exam Vitals and nursing note reviewed.  HENT:     Right Ear: Tympanic membrane normal.     Left Ear: Tympanic membrane normal.  Cardiovascular:     Rate and Rhythm: Normal rate and regular rhythm.     Heart sounds: No murmur heard.    No friction rub. No gallop.  Pulmonary:     Breath sounds: No wheezing, rhonchi or rales.  Musculoskeletal:      Right knee: Swelling and ecchymosis present. No effusion. Tenderness present. No medial joint line or lateral joint line tenderness. No LCL laxity or MCL laxity.     Instability Tests: Anterior drawer test negative.     Wt Readings from Last 3 Encounters:  04/28/23 207 lb (93.9 kg)  02/01/20 205 lb (93 kg)  01/10/20 212 lb 3.2 oz (96.3 kg)    BP 120/78   Pulse 72   Ht 5\' 7"  (1.702 m)   Wt 207 lb (93.9 kg)   SpO2 98%   BMI 32.42 kg/m   Assessment and Plan:  1. Contusion of right patella, initial encounter New onset.  Persistent.  Patient was outstretched and diving for the softball was in the outfield on 2 occasions with landing on right knee patella sustained some swelling and tenderness particularly with palpation over the patella.  There is pain with Apley grind.  There is no instability of the medial or lateral collateral ligaments.  Patient will continue to ice.  We will initiate meloxicam 15 mg once a day and may use Tylenol as needed.  And pending availability we will see if we can get the possibility of evaluation by sports medicine this week. - DG Knee Complete 4 Views Right    Elizabeth Sauer, MD

## 2023-05-02 ENCOUNTER — Encounter: Payer: BC Managed Care – PPO | Admitting: Family Medicine

## 2023-07-07 ENCOUNTER — Ambulatory Visit
Admission: EM | Admit: 2023-07-07 | Discharge: 2023-07-07 | Disposition: A | Discharge status: Home / Self Care | Payer: BLUE CROSS/BLUE SHIELD | Attending: Family Medicine | Admitting: Family Medicine

## 2023-07-07 DIAGNOSIS — Z20822 Contact with and (suspected) exposure to covid-19: Secondary | ICD-10-CM | POA: Insufficient documentation

## 2023-07-07 DIAGNOSIS — J069 Acute upper respiratory infection, unspecified: Secondary | ICD-10-CM | POA: Insufficient documentation

## 2023-07-07 LAB — SARS CORONAVIRUS 2 BY RT PCR: SARS Coronavirus 2 by RT PCR: NEGATIVE

## 2023-07-07 MED ORDER — IPRATROPIUM BROMIDE 0.06 % NA SOLN: 2.0000 | Freq: Four times a day (QID) | NASAL | 12 refills | Status: AC

## 2023-07-07 NOTE — ED Triage Notes (Signed)
Pt c/o congestion,HA & chills x1 day. Denies any fevers. Has tried allergy pills & dayquil w/o relief.

## 2023-07-07 NOTE — Discharge Instructions (Addendum)
Your COVID test is negative.    You can take Tylenol and/or Ibuprofen as needed for fever reduction and pain relief.    For cough: honey 1/2 to 1 teaspoon (you can dilute the honey in water or another fluid).  You can also use guaifenesin and dextromethorphan for cough. You can use a humidifier for chest congestion and cough.  If you don't have a humidifier, you can sit in the bathroom with the hot shower running.      For sore throat: try warm salt water gargles, Mucinex sore throat cough drops or cepacol lozenges, throat spray, warm tea or water with lemon/honey, popsicles or ice, or OTC cold relief medicine for throat discomfort. You can also purchase chloraseptic spray at the pharmacy or dollar store.   For congestion: take a daily anti-histamine like Zyrtec, Claritin, and a oral decongestant, such as pseudoephedrine.  You can also use Flonase 1-2 sprays in each nostril daily. Afrin is also a good option, if you do not have high blood pressure.    It is important to stay hydrated: drink plenty of fluids (water, gatorade/powerade/pedialyte, juices, or teas) to keep your throat moisturized and help further relieve irritation/discomfort.    Return or go to the Emergency Department if symptoms worsen or do not improve in the next few days

## 2023-07-07 NOTE — ED Provider Notes (Signed)
[facility]    CSN: [id number] Arrival date & time: 07/07/23  1107      History   Chief Complaint Chief Complaint  Patient presents with   Headache   Chills   Congestion    HPI Stephen Pratt is a 32 y.o. male.   HPI  History obtained from the patient. Stephen Pratt presents for nasal congestion, post-nasal drip, sinus pressure, headache, chills but no fever. Symptoms started last night/early this morning. Has some shortness of breath. No history of asthma. No vomiting or diarrhea. His girlfriend has COVID. Requests COVID test. Tried allergy medication and DayQuil this morning.    Fever : no  Chills: yes Sore throat: no   Cough: yes Sputum: yes Chest tightness: no Shortness of breath: yes Wheezing: no  Nasal congestion: yes Rhinorrhea: yes Myalgias: no Appetite: normal  Hydration: normal  Abdominal pain: no Nausea: no Vomiting: no Diarrhea: No Rash: No Sleep disturbance: no  Headache: yes     Past Medical History:  Diagnosis Date   Heart murmur    childhood   Wears contact lenses     Patient Active Problem List   Diagnosis Date Noted   Noninfectious diarrhea     Past Surgical History:  Procedure Laterality Date   APPENDECTOMY  2010   CHOLECYSTECTOMY  2010   COLONOSCOPY WITH PROPOFOL N/A 02/01/2020   Procedure: COLONOSCOPY WITH BIOPSY;  Surgeon: Midge Minium, MD;  Location: [facility];  Service: Endoscopy;  Laterality: N/A;  Priority 3       Home Medications    Prior to Admission medications   Medication Sig Start Date End Date Taking? Authorizing Provider  meloxicam (MOBIC) 15 MG tablet Take 1 tablet (15 mg total) by mouth daily. 04/28/23  Yes Duanne Limerick, MD    Family History History reviewed. No pertinent family history.  Social History Social History   Tobacco Use   Smoking status: Former    Types: Cigarettes    Quit date: 10/2019    Years since quitting: 3.6   Smokeless tobacco: Never   Tobacco comments:     smoked "socially" for about 10 yrs  Vaping Use   Vaping Use: Never used  Substance Use Topics   Alcohol use: Not Currently    Comment: may have a drink 4-5x/yr   Drug use: Never     Allergies   Sulfa antibiotics   Review of Systems Review of Systems: negative unless otherwise stated in HPI.      Physical Exam Triage Vital Signs ED Triage Vitals  Enc Vitals Group     BP 07/07/23 1205 120/84     Pulse Rate 07/07/23 1205 66     Resp 07/07/23 1205 16     Temp 07/07/23 1205 98.2 F (36.8 C)     Temp Source 07/07/23 1205 Oral     SpO2 07/07/23 1205 99 %     Weight 07/07/23 1204 200 lb (90.7 kg)     Height 07/07/23 1204 5\' 7"  (1.702 m)     Head Circumference --      Peak Flow --      Pain Score 07/07/23 1216 4     Pain Loc --      Pain Edu? --      Excl. in GC? --    No data found.  Updated Vital Signs BP 120/84 (BP Location: Left Arm)   Pulse 66   Temp 98.2 F (36.8 C) (Oral)   Resp 16  Ht 5\' 7"  (1.702 m)   Wt 90.7 kg   SpO2 99%   BMI 31.32 kg/m   Visual Acuity Right Eye Distance:   Left Eye Distance:   Bilateral Distance:    Right Eye Near:   Left Eye Near:    Bilateral Near:     Physical Exam GEN:     alert, non-toxic appearing male in no distress ***   HENT:  mucus membranes moist, oropharyngeal ***without lesions or ***erythema, no*** tonsillar hypertrophy or exudates, *** moderate erythematous edematous turbinates, ***clear nasal discharge, ***bilateral TM normal EYES:   pupils equal and reactive, ***no scleral injection or discharge NECK:  normal ROM, no ***lymphadenopathy, ***no meningismus   RESP:  no increased work of breathing, ***clear to auscultation bilaterally CVS:   regular rate ***and rhythm Skin:   warm and dry, no rash on visible skin***    UC Treatments / Results  Labs (all labs ordered are listed, but only abnormal results are displayed) Labs Reviewed  SARS CORONAVIRUS 2 BY RT PCR    EKG   Radiology No results  found.  Procedures Procedures (including critical care time)  Medications Ordered in UC Medications - No data to display  Initial Impression / Assessment and Plan / UC Course  I have reviewed the triage vital signs and the nursing notes.  Pertinent labs & imaging results that were available during my care of the patient were reviewed by me and considered in my medical decision making (see chart for details).       Pt is a 32 y.o. male who presents for *** days of respiratory symptoms. [name] is ***afebrile here without recent antipyretics. Satting well on room air. Overall pt is ***non-toxic appearing, well hydrated, without respiratory distress. Pulmonary exam ***is unremarkable.  COVID testing obtained ***and was negative. ***Pt to quarantine until COVID test results or longer if positive.  I will call patient with test results, if positive. History consistent with ***viral respiratory illness. Discussed symptomatic treatment.  Explained lack of efficacy of antibiotics in viral disease.  Typical duration of symptoms discussed.   Return and ED precautions given and voiced understanding. Discussed MDM, treatment plan and plan for follow-up with patient*** who agrees with plan.     Final Clinical Impressions(s) / UC Diagnoses   Final diagnoses:  None     Discharge Instructions      Your COVID test is negative.    You can take Tylenol and/or Ibuprofen as needed for fever reduction and pain relief.    For cough: honey 1/2 to 1 teaspoon (you can dilute the honey in water or another fluid).  You can also use guaifenesin and dextromethorphan for cough. You can use a humidifier for chest congestion and cough.  If you don't have a humidifier, you can sit in the bathroom with the hot shower running.      For sore throat: try warm salt water gargles, Mucinex sore throat cough drops or cepacol lozenges, throat spray, warm tea or water with lemon/honey, popsicles or ice, or OTC cold relief  medicine for throat discomfort. You can also purchase chloraseptic spray at the pharmacy or dollar store.   For congestion: take a daily anti-histamine like Zyrtec, Claritin, and a oral decongestant, such as pseudoephedrine.  You can also use Flonase 1-2 sprays in each nostril daily. Afrin is also a good option, if you do not have high blood pressure.    It is important to stay hydrated: drink plenty of fluids (water,  gatorade/powerade/pedialyte, juices, or teas) to keep your throat moisturized and help further relieve irritation/discomfort.    Return or go to the Emergency Department if symptoms worsen or do not improve in the next few days    ED Prescriptions   None    PDMP not reviewed this encounter.
